# Patient Record
Sex: Male | Born: 1980 | Hispanic: Yes | Marital: Single | Smoking: Never smoker
Health system: Southern US, Community
[De-identification: ages and names within clinical notes are randomized; demographics above are authoritative.]

---

## 2014-01-22 ENCOUNTER — Encounter (HOSPITAL_COMMUNITY): Payer: Self-pay | Admitting: Emergency Medicine

## 2014-01-22 ENCOUNTER — Emergency Department (HOSPITAL_COMMUNITY)
Admission: EM | Admit: 2014-01-22 | Discharge: 2014-01-22 | Disposition: A | Discharge status: Home / Self Care | Payer: Self-pay | Attending: Emergency Medicine | Admitting: Emergency Medicine

## 2014-01-22 DIAGNOSIS — L03119 Cellulitis of unspecified part of limb: Principal | ICD-10-CM

## 2014-01-22 DIAGNOSIS — R269 Unspecified abnormalities of gait and mobility: Secondary | ICD-10-CM | POA: Insufficient documentation

## 2014-01-22 DIAGNOSIS — L02419 Cutaneous abscess of limb, unspecified: Secondary | ICD-10-CM | POA: Insufficient documentation

## 2014-01-22 DIAGNOSIS — M25569 Pain in unspecified knee: Secondary | ICD-10-CM | POA: Insufficient documentation

## 2014-01-22 DIAGNOSIS — IMO0001 Reserved for inherently not codable concepts without codable children: Secondary | ICD-10-CM | POA: Insufficient documentation

## 2014-01-22 DIAGNOSIS — L0291 Cutaneous abscess, unspecified: Secondary | ICD-10-CM

## 2014-01-22 DIAGNOSIS — L0390 Cellulitis, unspecified: Secondary | ICD-10-CM

## 2014-01-22 MED ORDER — HYDROCODONE-ACETAMINOPHEN 5-325 MG PO TABS: ORAL_TABLET | ORAL | Status: DC

## 2014-01-22 MED ORDER — CEPHALEXIN 500 MG PO CAPS: 500.0000 mg | ORAL_CAPSULE | Freq: Four times a day (QID) | ORAL | Status: DC

## 2014-01-22 MED ORDER — CEPHALEXIN 250 MG PO CAPS
500.0000 mg | ORAL_CAPSULE | Freq: Once | ORAL | Status: AC
  Administered 2014-01-22: 500 mg via ORAL
  Filled 2014-01-22: qty 2

## 2014-01-22 MED ORDER — SULFAMETHOXAZOLE-TRIMETHOPRIM 800-160 MG PO TABS: 1.0000 | ORAL_TABLET | Freq: Two times a day (BID) | ORAL | Status: AC

## 2014-01-22 NOTE — ED Notes (Signed)
Pt reports he ran into a cactus on Friday. Pt pulled out multiple spines from skin but now has a red swollen knee.

## 2014-01-22 NOTE — ED Provider Notes (Signed)
  Face-to-face evaluation   History: Patient injured his left knee, when he brushed against a cactus, several days ago. Since then he has had gradually worse. Pain, swelling, and drainage.  Physical exam: Alert, cooperative. He has normal active range of motion. There is redness and swelling of the anterior and medial, left knee. There is an area of central fluctuance and drainage.  Assessment- abscess with cellulitis. Doubt septic arthritis  Medical screening examination/treatment/procedure(s) were conducted as a shared visit with non-physician practitioner(s) and myself.  I personally evaluated the patient during the encounter  Flint MelterElliott L Lindzie Boxx, MD 01/22/14 [phone]

## 2014-01-22 NOTE — Discharge Instructions (Signed)
Please read and follow all provided instructions.  Your diagnoses today include:  1. Cellulitis and abscess     Tests performed today include:  Vital signs. See below for your results today.   Medications prescribed:   Bactrim (trimethoprim/sulfamethoxazole) - antibiotic  You have been prescribed an antibiotic medicine: take the entire course of medicine even if you are feeling better. Stopping early can cause the antibiotic not to work.   Vicodin (hydrocodone/acetaminophen) - narcotic pain medication  DO NOT drive or perform any activities that require you to be awake and alert because this medicine can make you drowsy. BE VERY CAREFUL not to take multiple medicines containing Tylenol (also called acetaminophen). Doing so can lead to an overdose which can damage your liver and cause liver failure and possibly death.  Take any prescribed medications only as directed.   Home care instructions:   Follow any educational materials contained in this packet  Follow-up instructions: Return to the Emergency Department in 48 hours for a recheck if your symptoms are not significantly improved.  Please follow-up with your primary care provider in the next 1 week for further evaluation of your symptoms.   Return instructions:  Return to the Emergency Department if you have:  Fever  Worsening symptoms  Worsening pain  Worsening swelling  Redness of the skin that moves away from the affected area, especially if it streaks away from the affected area   Any other emergent concerns   Your vital signs today were: BP 130/82   Pulse 84   Temp(Src) 99.5 F (37.5 C) (Oral)   Resp 18   Ht 5\' 6"  (1.676 m)   Wt 170 lb (77.111 kg)   BMI 27.45 kg/m2   SpO2 97% If your blood pressure (BP) was elevated above 135/85 this visit, please have this repeated by your doctor within one month. --------------

## 2014-01-22 NOTE — ED Provider Notes (Signed)
CSN: [id number]     Arrival date & time 01/22/14  4098 History   First MD Initiated Contact with Patient 01/22/14 0845     Chief Complaint  Patient presents with  . Knee Injury     (Consider location/radiation/quality/duration/timing/severity/associated sxs/prior Treatment) HPI Comments: Patient presents with knee pain, redness, and swelling that started 3 days ago after he scratched the skin over the knee on the spines of a cactus. No fever, chills. No diabetes or other chronic medical problems. No treatments PTA. The onset of this condition was gradual. The course is worsening. Aggravating factors: walking and movement. Alleviating factors: none.    The history is provided by the patient.    History reviewed. No pertinent past medical history. History reviewed. No pertinent past surgical history. History reviewed. No pertinent family history. History  Substance Use Topics  . Smoking status: Never Smoker   . Smokeless tobacco: Never Used  . Alcohol Use: No    Review of Systems  Constitutional: Negative for fever and chills.  Gastrointestinal: Negative for nausea and vomiting.  Musculoskeletal: Positive for arthralgias, gait problem, joint swelling and myalgias.  Skin: Positive for color change.  Hematological: Negative for adenopathy.    Allergies  Review of patient's allergies indicates not on file.  Home Medications   Prior to Admission medications   Not on File   BP 130/82  Pulse 84  Temp(Src) 99.5 F (37.5 C) (Oral)  Resp 18  Ht 5\' 6"  (1.676 m)  Wt 170 lb (77.111 kg)  BMI 27.45 kg/m2  SpO2 97%  Physical Exam  Nursing note and vitals reviewed. Constitutional: He appears well-developed and well-nourished.  HENT:  Head: Normocephalic and atraumatic.  Eyes: Conjunctivae are normal.  Neck: Normal range of motion. Neck supple.  Pulmonary/Chest: No respiratory distress.  Musculoskeletal: He exhibits edema and tenderness.       Left hip: Normal.       Left  knee: He exhibits decreased range of motion (decreased flexion actively, full ROM passively with pain.), swelling and effusion.       Left ankle: Normal.  Neurological: He is alert.  Skin: Skin is warm and dry.  Extensive area of cellulitis over anterior and medial knee.  Psychiatric: He has a normal mood and affect.          ED Course  Procedures (including critical care time) Labs Review Labs Reviewed - No data to display  Imaging Review No results found.   EKG Interpretation None      8:55 AM Patient seen and examined. Pt discussed with Dr. Effie Shy.    Vital signs reviewed and are as follows: Filed Vitals:   01/22/14 0850  BP: 130/82  Pulse: 84  Temp: 99.5 F (37.5 C)  Resp: 18   Pt seen with Dr. Effie Shy. Plan: I&D, abx, pain medication.   INCISION AND DRAINAGE Performed by: Carolee Rota Consent: Verbal consent obtained. Risks and benefits: risks, benefits and alternatives were discussed Type: abscess  Body area: L knee  Anesthesia: local infiltration  Stab incision was made to a depth of 1/4 inch with a scalpel.  Local anesthetic: lidocaine 2% with epinephrine  Anesthetic total: 3 ml  Complexity: complex Blunt dissection to break up loculations, abscess cavity noted, mostly dry  Drainage: purulent  Drainage amount: minimal  Packing material: none  Patient tolerance: Patient tolerated the procedure well with no immediate complications.   9:54 AM The patient was urged to return to the Emergency Department urgently with worsening pain,  swelling, expanding erythema especially if it streaks away from the affected area, fever, or if they have any other concerns.   The patient was urged to return to the Emergency Department or go to their PCP in 48 hours for wound recheck if the area is not significantly improved.  The patient verbalized understanding and stated agreement with this plan.   Patient counseled on use of narcotic pain medications.  Counseled not to combine these medications with others containing tylenol. Urged not to drink alcohol, drive, or perform any other activities that requires focus while taking these medications. The patient verbalizes understanding and agrees with the plan.   MDM   Final diagnoses:  Cellulitis and abscess   Patient with skin abscess with associated cellulitis amenable to incision and drainage. Abx indicated. Given reasonable ROM, do not suspect joint infection. Spines of cactus were small. Discussed with and seen by attending who agrees.     Renne CriglerJoshua Rishav Rockefeller, PA-C 01/22/14 [phone]

## 2014-01-22 NOTE — ED Notes (Signed)
Declined W/C at D/C and was escorted to lobby by RN. 

## 2014-01-25 ENCOUNTER — Encounter (HOSPITAL_COMMUNITY): Payer: Self-pay | Admitting: Emergency Medicine

## 2014-01-25 ENCOUNTER — Emergency Department (HOSPITAL_COMMUNITY)
Admission: EM | Admit: 2014-01-25 | Discharge: 2014-01-25 | Disposition: A | Discharge status: Home / Self Care | Payer: Self-pay | Attending: Emergency Medicine | Admitting: Emergency Medicine

## 2014-01-25 DIAGNOSIS — Z79899 Other long term (current) drug therapy: Secondary | ICD-10-CM | POA: Insufficient documentation

## 2014-01-25 DIAGNOSIS — Z4801 Encounter for change or removal of surgical wound dressing: Secondary | ICD-10-CM | POA: Insufficient documentation

## 2014-01-25 DIAGNOSIS — L539 Erythematous condition, unspecified: Secondary | ICD-10-CM | POA: Insufficient documentation

## 2014-01-25 DIAGNOSIS — R609 Edema, unspecified: Secondary | ICD-10-CM | POA: Insufficient documentation

## 2014-01-25 DIAGNOSIS — Z5189 Encounter for other specified aftercare: Secondary | ICD-10-CM

## 2014-01-25 NOTE — ED Provider Notes (Signed)
Medical screening examination/treatment/procedure(s) were performed by non-physician practitioner and as supervising physician I was immediately available for consultation/collaboration.   EKG Interpretation None       Doug SouSam Zelia Yzaguirre, MD 01/25/14 1626

## 2014-01-25 NOTE — Discharge Instructions (Signed)
Be sure to continue taking all of your antibiotics.  You may follow up in 3 days for a wound recheck if not improving, return sooner if worsening.  You may use warm compresses like a warm wash cloth a few times a day to help with healing.  See below for further instructions.

## 2014-01-25 NOTE — ED Provider Notes (Signed)
CSN: [id number]     Arrival date & time 01/25/14  0718 History   First MD Initiated Contact with Patient 01/25/14 0730     Chief Complaint  Patient presents with  . Wound Check     (Consider location/radiation/quality/duration/timing/severity/associated sxs/prior Treatment) Patient is a 33 y.o. male presenting with wound check. The history is provided by the patient. The history is limited by a language barrier. A language interpreter was used.  Wound Check Associated symptoms include arthralgias. Pertinent negatives include no chills, fever, myalgias, nausea or vomiting.  Pt is a 33yo male presenting to ED for wound recheck after pt had I&D performed on 01/22/14 due to an abscess on his left knee from an injury from a cactus.  Today, pt states wound is improving, pain is only 1/10, aching, worse with movement. States he is still taking bactrim and pain medication as prescribed.  Denies fever, n/v/d.     History reviewed. No pertinent past medical history. History reviewed. No pertinent past surgical history. No family history on file. History  Substance Use Topics  . Smoking status: Never Smoker   . Smokeless tobacco: Never Used  . Alcohol Use: No    Review of Systems  Constitutional: Negative for fever and chills.  Gastrointestinal: Negative for nausea and vomiting.  Musculoskeletal: Positive for arthralgias. Negative for myalgias.       Left knee  Skin: Positive for color change and wound.  All other systems reviewed and are negative.     Allergies  Review of patient's allergies indicates no known allergies.  Home Medications   Prior to Admission medications   Medication Sig Start Date End Date Taking? Authorizing Provider  HYDROcodone-acetaminophen (NORCO/VICODIN) 5-325 MG per tablet Take 1-2 tablets every 6 hours as needed for severe pain 01/22/14  Yes Renne CriglerJoshua Geiple, PA-C  sulfamethoxazole-trimethoprim (BACTRIM DS,SEPTRA DS) 800-160 MG per tablet Take 1 tablet by mouth 2  (two) times daily. 01/22/14 01/29/14 Yes Joshua Geiple, PA-C   BP 120/66  Pulse 71  Temp(Src) 98.3 F (36.8 C) (Oral)  Resp 18  SpO2 100% Physical Exam  Nursing note and vitals reviewed. Constitutional: He is oriented to person, place, and time. He appears well-developed and well-nourished.  HENT:  Head: Normocephalic and atraumatic.  Eyes: EOM are normal.  Neck: Normal range of motion.  Cardiovascular: Normal rate.   Pulmonary/Chest: Effort normal.  Musculoskeletal: He exhibits edema and tenderness.  Left knee: mild edema and erythema, able to flex past 90 degrees but mildly limited due to pain. Erythema and edema has receded 2-3cm from initial pen markings made on 01/22/14.  No calf tenderness. FROM left hip and left ankle.   Neurological: He is alert and oriented to person, place, and time.  Skin: Skin is warm and dry. There is erythema.  Left knee: 2cm area of induration surrounding surgical site from I&D. No active discharge.  No red streaking. No fluctuance.   Psychiatric: He has a normal mood and affect. His behavior is normal.    ED Course  Procedures (including critical care time) Labs Review Labs Reviewed - No data to display  Imaging Review No results found.   EKG Interpretation None      MDM   Final diagnoses:  Wound check, abscess    Pt presenting to ED for recheck of left knee after I&D of abscess on 01/22/14.  Per pt and via PE, cellulitis appears to be improving well.  Do not believe additional I&D needed at this time. Pt is  still taking bactrim. Denies fever, n/v/d.  Will discharge home. Advised to continue taking antibiotics as prescribed as well as encouraged warm compresses to help with healing process. Advised to f/u in 3 days if not improving, sooner if worsening.     Junius Finner, PA-C 01/25/14 [phone]

## 2014-01-25 NOTE — ED Notes (Signed)
Patient here for wound check on left knee, here approx 3 days ago for I and D of wound, marked with marking pen and d/c home with instructions to follow up today for recheck

## 2014-03-26 ENCOUNTER — Encounter (HOSPITAL_COMMUNITY): Payer: Self-pay | Admitting: Emergency Medicine

## 2014-03-26 ENCOUNTER — Emergency Department (HOSPITAL_COMMUNITY)
Admission: EM | Admit: 2014-03-26 | Discharge: 2014-03-26 | Disposition: A | Discharge status: Home / Self Care | Payer: Self-pay | Source: Home / Self Care

## 2014-03-26 ENCOUNTER — Emergency Department (INDEPENDENT_AMBULATORY_CARE_PROVIDER_SITE_OTHER)
Admission: EM | Admit: 2014-03-26 | Discharge: 2014-03-26 | Disposition: A | Discharge status: Home / Self Care | Payer: Self-pay | Source: Home / Self Care | Attending: Family Medicine | Admitting: Family Medicine

## 2014-03-26 DIAGNOSIS — Z23 Encounter for immunization: Secondary | ICD-10-CM

## 2014-03-26 DIAGNOSIS — L03116 Cellulitis of left lower limb: Secondary | ICD-10-CM

## 2014-03-26 MED ORDER — TETANUS-DIPHTH-ACELL PERTUSSIS 5-2.5-18.5 LF-MCG/0.5 IM SUSP
0.5000 mL | Freq: Once | INTRAMUSCULAR | Status: AC
  Administered 2014-03-26: 0.5 mL via INTRAMUSCULAR

## 2014-03-26 MED ORDER — SULFAMETHOXAZOLE-TMP DS 800-160 MG PO TABS: 1.0000 | ORAL_TABLET | Freq: Two times a day (BID) | ORAL | Status: AC

## 2014-03-26 MED ORDER — TETANUS-DIPHTH-ACELL PERTUSSIS 5-2.5-18.5 LF-MCG/0.5 IM SUSP
INTRAMUSCULAR | Status: AC
  Filled 2014-03-26: qty 0.5

## 2014-03-26 NOTE — Discharge Instructions (Signed)
Warm compresses to affected area four times a day until healed. Take medication as prescribed along with ibuprofen as directed on packaging for pain. Return to clinic in 2 days to have wound re-checked. Wash area 2 x day with warm soapy water, pat dry and cover with clean dry dressing. Celulitis (Cellulitis) La celulitis es una infeccin de la piel y del tejido subyacente. El rea infectada generalmente est de color rojo y duele. Ocurre con ms frecuencia en los brazos y en las piernas.  CAUSAS  La causa es una bacteria que ingresa en la piel a travs de grietas o cortes. Los tipos ms frecuentes de bacterias que causan celulitis son los estafilococos y los estreptococos. SIGNOS Y SNTOMAS   Enrojecimiento y calor.  Hinchazn.  Sensibilidad o dolor.  Grant Ruts. DIAGNSTICO  Por lo general, el mdico puede diagnosticar esta afeccin con un examen fsico. Es posible que le indiquen anlisis de [place]. TRATAMIENTO  El tratamiento consiste en tomar antibiticos. INSTRUCCIONES PARA EL CUIDADO EN EL HOGAR   Tome los antibiticos como le indic el mdico. Termine los antibiticos aunque comience a sentirse mejor.  Mantenga el brazo o la pierna elevados para reducir la hinchazn.  Aplique paos calientes en la zona hasta 4 veces al da para Primary school teacher.  Tome los medicamentos solamente como se lo haya indicado el mdico.  Concurra a todas las visitas de control como se lo haya indicado el mdico. SOLICITE ATENCIN MDICA SI:   Brett Fairy lnea roja en la piel que sale desde la zona infectada.  El rea roja se extiende o se vuelve de color oscuro.  El hueso o la articulacin que se encuentran por debajo de la zona infectada le duelen despus de que la piel se [place].  La infeccin se repite en la misma zona o en una zona diferente.  Tiene un bulto inflamado en la zona infectada.  Presenta nuevos sntomas.  Tiene fiebre. SOLICITE ATENCIN MDICA DE INMEDIATO SI:   Se siente muy  somnoliento.  Tiene vmitos o diarrea.  Se siente enfermo (malestar general) con dolores musculares. ASEGRESE DE QUE:   Comprende estas instrucciones.  Controlar su afeccin.  Recibir ayuda de inmediato si no mejora o si empeora. Document Released: 03/18/2005 Document Revised: 10/23/2013 [facility] Patient Information 2015 [place], [place]. This information is not intended to replace advice given to you by your health care provider. Make sure you discuss any questions you have with your health care provider.  MRSA, Pacientes ambulatorios (Community-Associated MRSA)  CA-MRSA significa estafilococo aureus resistente a la meticilina asociado a la comunidad. Se trata de un tipo de bacteria resistente a algunos antibiticos que puede causar infecciones en la piel y otras zonas. El staphylococcus aureus es una bacteria que vive normalmente en la piel o en la nariz. el estafilococo que se encuentra en la superficie de la piel o en la nariz no causa problemas. Sin embargo, si ingresa al organismo a travs de un corte o una herida, puede producir una infeccin. Hasta no hace mucho tiempo, las infecciones por estafilococo tipo MRSA ocurran en hospitales y otros establecimientos sanitarios. Ahora se observa que causa infecciones en toda la comunidad. El SMRA ocasiona problemas de salud graves que pueden llevar a la [place]. Pero se est volviendo un problema cada vez ms frecuente. Se sabe que se contagia en lugares en los que hay mucha gente, en crceles y prisiones, y en situaciones en las que se producen contactos piel a piel, como durante eventos deportivos o  en vestuarios. Puede contagiarse a travs de utensilios compartidos, como rasuradotas, toallas o equipos deportivos.  CAUSAS Safeco Corporationodos los estafilococos, incluyendo el SAMR normalmente son inocuos excepto que puedan entrar al torrente sanguneo a travs de un rasguo, un corte o una herida como la de [place]una ciruga. Safeco Corporationodos los estafilococos, incluyendo el  SAMR pueden contagiarse de Neomia Dearuna persona a otra tocando objetos contaminados as como a travs del Chemical engineercontacto directo.  MRSA ahora causa la enfermedad en aquellas personas que no han estado en hospitales u otros establecimientos sanitarios. Los casos de enfermedades ocasionadas por MRSA en la comunidad se han asociado al:  Uso reciente de antibiticos.  Compartir toallas o ropa contaminada.  Padecer enfermedades activas de la piel.  Participar en deportes de contacto.  Vivir en condiciones de superpoblacin.  Uso de medicamentos por va intravenosa.  Las infecciones por MRSA relacionadas con la comunidad son normalmente infecciones de la piel, pero pueden causar otros trastornos graves.  La bacteria estafilococo es una de las causas ms comunes de infecciones de la piel. Sin embargo, tambin son causa frecuente de neumona, infecciones en los TransMontaignehuesos o las articulaciones e infecciones en la [place]. DIAGNSTICO El diagnstico se realiza a travs de cultivos o pruebas especiales de Luxembourgmuestra de fluidos provenientes de:  Hisopado tomado de los cortes o heridas en las zonas infectadas.  Hisopados nasales.  Saliva o mucus proveniente de los pulmones (esputo).  Orina.  Sangre. Algunas personas son portadores, pero no presentan signos de infeccin. Esto significa que llevan el germen en su piel o en su nariz pero no desarrollan la infeccin.  TRATAMIENTO El tratamiento vara y se basa en la gravedad, la profundidad y la extensin del proceso infeccioso. Por ejemplo:  Algunas infecciones de la piel, como pequeos granos o abscesos pueden tratarse drenando el pus del lugar de la infeccin.  Las infecciones ms profundas o ms diseminadas de los tejidos blandos generalmente se tratan con ciruga para drenar el pus y antibiticos por va intravenosa o por boca. UnumProvidentEste tratamiento se recomienda an en el caso de las mujeres Meadowlakesembarazadas.  Las infecciones ms graves requieren la hospitalizacin. En  el caso de necesitar antibiticos, el tratamiento durar varias semanas. PREVENCIN Debido a que [place] personas son portadoras, la prevencin del contagio de la bacteria de Neomia Dearuna persona a otra es lo ms importante. El mejor modo de prevenir la diseminacin de las bacterias y otros grmenes es a travs del correcto lavado de las manos o usando desinfectantes con base de alcohol. A continuacin se indican otras formas de evitar el contagio en los establecimientos comunitario.   Verdie DrownLave y frote sus manos con agua tibia y jabn durante al menos 15 segundos. Tambin puede usar desinfectantes con base de alcohol cuando no se dispone de [place] y Bel[place]jabn.  Asegrese de Starwood Hotelsque las personas con las que convive se lavan las manos con frecuencia.  No comparta utensilios personales. Por ejemplo, evite compartir rasuradoras, toallas, ropa y equipos deportivos.  Lave y seque su ropa, y la ropa de cama, a las temperaturas ms calientes recomendadas en las etiquetas.  Mantenga las heridas cubiertas. El pus de las llagas infectadas puede contener las bacterias. Mantenga los cortes y las raspaduras limpios y cubiertos con una venda hasta que curen.  Si tiene una herida que parece estar infectada, consulte con su mdico si debe realizar un cultivo.  Si est amamantando, converse con su mdico acerca de este problema. Podr pedirle que suspenda por un tiempo la Tour managerlactancia materna. INSTRUCCIONES PARA EL  CUIDADO DOMICILIARIO  Utilice los medicamentos tal como se le indic. No saltee medicamentos ni abandone su uso prematuramente slo porque cree que est mejorando.  Los que presentan este tipo de infeccin deben Multimedia programmer contacto con los que los rodean. No use toallas, rasuradotas, cepillos de dientes o ropa de cama que usarn [employer].  Para luchar contra la infeccin, siga las indicaciones de su mdico para el cuidado de la herida. Lvese bien las manos antes y despus de cambiarse el vendaje.  Si tiene un  dispositivo intravascular, como un catter, asegrese que sabe como cuidarlo.  Asegrese de informar a todos los profesionales que lo asistan que usted sufre MRSA, de modo que puedan tomar las medidas necesarias relacionadas con su infeccin. SOLICITE ATENCIN MDICA DE INMEDIATO SI:  La infeccin parece empeorar y observa:  Aumento del calor, inflamacin o dolor alrededor de la herida.  Aparece una lnea roja que se extiende desde el lugar de la infeccin.  La zona de la infeccin se torna de un color oscuro.  La herida drena un lquido de color marrn, amarillo o verdoso.  Despide un olor ftido.  Comienza a sentir nuseas y vmitos o no puede [employer].  Tiene fiebre.  Su beb tiene ms de 3 meses y su temperatura rectal es de 102 F (38.9 C) o ms.  Su beb tiene 3 meses o menos y su temperatura rectal es de 100.4 F (38 C) o ms.  Presenta dificultades respiratorias. EST SEGURO QUE:   Comprende las instrucciones para el alta mdica.  Controlar su enfermedad.  Solicitar atencin mdica de inmediato segn las indicaciones. Document Released: 09/15/2007 Document Revised: 08/31/2011 [facility] Patient Information 2015 [place], [place]. This information is not intended to replace advice given to you by your health care provider. Make sure you discuss any questions you have with your health care provider.

## 2014-03-26 NOTE — ED Notes (Signed)
C/o 4 day duration of pain and swelling left knee. Left knee hot to touch, lesion noted

## 2014-03-26 NOTE — ED Provider Notes (Signed)
CSN: [id number]     Arrival date & time 03/26/14  1137 History   First MD Initiated Contact with Patient 03/26/14 1237     Chief Complaint  Patient presents with  . Knee Pain   (Consider location/radiation/quality/duration/timing/severity/associated sxs/prior Treatment) HPI Comments: Reports area of redness, swelling and tenderness at left lateral knee, similar to that of 01/2014. Reports complete resolution of symptoms in August with use of TMP/SMX. Denies fever/chills. 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 No known injury.  PCP: none   Patient is a 33 y.o. male presenting with knee pain. The history is provided by the patient. The history is limited by a language barrier. A language interpreter was used.  Knee Pain Location:  Knee Time since incident:  4 days Injury: no   Knee location:  L knee Chronicity:  Recurrent   History reviewed. No pertinent past medical history. History reviewed. No pertinent past surgical history. History reviewed. No pertinent family history. History  Substance Use Topics  . Smoking status: Never Smoker   . Smokeless tobacco: Never Used  . Alcohol Use: No    Review of Systems  All other systems reviewed and are negative.   Allergies  Review of patient's allergies indicates no known allergies.  Home Medications   Prior to Admission medications   Medication Sig Start Date End Date Taking? Authorizing Provider  HYDROcodone-acetaminophen (NORCO/VICODIN) 5-325 MG per tablet Take 1-2 tablets every 6 hours as needed for severe pain 01/22/14   Renne CriglerJoshua Geiple, PA-C  sulfamethoxazole-trimethoprim (BACTRIM DS) 800-160 MG per tablet Take 1 tablet by mouth 2 (two) times daily. X 7 days 03/26/14   Jess BartersJennifer Lee H Izan Miron, PA   BP 112/74  Pulse 72  Temp(Src) 98.9 F (37.2 C) (Oral)  Resp 16  SpO2 98% Physical Exam  Nursing note and vitals reviewed. Constitutional: He is oriented to person, place, and time. He appears  well-developed and well-nourished. No distress.  HENT:  Head: Normocephalic and atraumatic.  Eyes: Conjunctivae are normal. No scleral icterus.  Cardiovascular: Normal rate.   Pulmonary/Chest: Effort normal.  Musculoskeletal: Normal range of motion.       Left knee: He exhibits swelling and erythema. He exhibits normal range of motion, no effusion, no ecchymosis, no deformity and no laceration.       Legs: Neurological: He is alert and oriented to person, place, and time.  Skin: Skin is warm and dry. No rash noted. There is erythema. No pallor.  13cm x 8cm area of induration and erythema at left lateral knee.  Psychiatric: He has a normal mood and affect. His behavior is normal.    ED Course  Procedures (including critical care time) Labs Review Labs Reviewed - No data to display  Imaging Review No results found.   MDM   1. Cellulitis of left knee    Left knee cellulitis without clinical indication of septic arthritis. Area of redness and induration outlined with surgical marker and wound dressed. No area of fluctuance to suggest need for I&D. [facility] wound re-check and to take TMP/SMX as prescribed along with warm compresses to affected area QID until healed. [facility] given that central portion of wound open and patient unaware of his tetanus status.     Ria ClockJennifer Lee H Nazareth Kirk, GeorgiaPA 03/26/14 1336

## 2014-03-28 ENCOUNTER — Encounter (HOSPITAL_COMMUNITY): Payer: Self-pay | Admitting: Emergency Medicine

## 2014-03-28 ENCOUNTER — Emergency Department (INDEPENDENT_AMBULATORY_CARE_PROVIDER_SITE_OTHER): Payer: Self-pay

## 2014-03-28 ENCOUNTER — Emergency Department (INDEPENDENT_AMBULATORY_CARE_PROVIDER_SITE_OTHER)
Admission: EM | Admit: 2014-03-28 | Discharge: 2014-03-28 | Disposition: A | Discharge status: Home / Self Care | Payer: Self-pay | Source: Home / Self Care | Attending: Family Medicine | Admitting: Family Medicine

## 2014-03-28 DIAGNOSIS — L03119 Cellulitis of unspecified part of limb: Secondary | ICD-10-CM

## 2014-03-28 DIAGNOSIS — L02419 Cutaneous abscess of limb, unspecified: Secondary | ICD-10-CM

## 2014-03-28 DIAGNOSIS — L089 Local infection of the skin and subcutaneous tissue, unspecified: Secondary | ICD-10-CM

## 2014-03-28 MED ORDER — DOXYCYCLINE HYCLATE 100 MG PO CAPS: 100.0000 mg | ORAL_CAPSULE | Freq: Two times a day (BID) | ORAL | Status: DC

## 2014-03-28 MED ORDER — CEPHALEXIN 500 MG PO CAPS: 500.0000 mg | ORAL_CAPSULE | Freq: Two times a day (BID) | ORAL | Status: DC

## 2014-03-28 NOTE — ED Provider Notes (Signed)
Medical screening examination/treatment/procedure(s) were performed by a resident physician or non-physician practitioner and as the supervising physician I was immediately available for consultation/collaboration.  Jacqulyne Gladue, MD    Monroe Toure S Yarethzi Branan, MD 03/28/14 0759 

## 2014-03-28 NOTE — ED Provider Notes (Signed)
CSN: [id number]     Arrival date & time 03/28/14  1305 History   First MD Initiated Contact with Patient 03/28/14 1444     Chief Complaint  Patient presents with  . Knee Pain   (Consider location/radiation/quality/duration/timing/severity/associated sxs/prior Treatment) Patient is a 33 y.o. male presenting with knee pain. The history is provided by the patient.  Knee Pain Location:  Knee Injury: yes   Mechanism of injury comment:  Cactus puncture of left knee 8/3 , improved with abx, then relapsed 10/5,  now purulent drainage Knee location:  L knee Pain details:    Severity:  Moderate   Progression:  Worsening Chronicity:  Recurrent Dislocation: no   Foreign body present:  Unable to specify Tetanus status:  Up to date Prior injury to area:  Yes Associated symptoms: decreased ROM and swelling     History reviewed. No pertinent past medical history. History reviewed. No pertinent past surgical history. History reviewed. No pertinent family history. History  Substance Use Topics  . Smoking status: Never Smoker   . Smokeless tobacco: Never Used  . Alcohol Use: No    Review of Systems  Musculoskeletal: Positive for joint swelling.  Skin: Positive for wound.    Allergies  Review of patient's allergies indicates no known allergies.  Home Medications   Prior to Admission medications   Medication Sig Start Date End Date Taking? Authorizing Provider  HYDROcodone-acetaminophen (NORCO/VICODIN) 5-325 MG per tablet Take 1 tablet by mouth every 4 (four) hours as needed for moderate pain or severe pain (para dolor). 03/29/14   Trixie DredgeEmily West, PA-C  ibuprofen (ADVIL,MOTRIN) 800 MG tablet Take 1 tablet (800 mg total) by mouth every 8 (eight) hours as needed for mild pain or moderate pain (para dolor). 03/29/14   Trixie DredgeEmily West, PA-C  sulfamethoxazole-trimethoprim (BACTRIM DS) 800-160 MG per tablet Take 1 tablet by mouth 2 (two) times daily. X 7 days 03/26/14   Jess BartersJennifer Lee H Presson, PA   BP  126/64  Pulse 64  Temp(Src) 98.9 F (37.2 C) (Oral)  SpO2 96% Physical Exam  Nursing note and vitals reviewed. Constitutional: He is oriented to person, place, and time. He appears well-developed and well-nourished.  Musculoskeletal: He exhibits tenderness.       Left knee: He exhibits decreased range of motion and swelling.       Legs: Neurological: He is alert and oriented to person, place, and time.  Skin: Skin is warm. There is erythema.    ED Course  Procedures (including critical care time) Labs Review Labs Reviewed  CULTURE, ROUTINE-ABSCESS    Imaging Review No results found.   MDM   1. Infection of skin of knee   2. Cellulitis and abscess of lower extremity        Linna HoffJames D Stefanie Hodgens, MD 04/15/14 1056

## 2014-03-28 NOTE — Discharge Instructions (Signed)
Absceso °(Abscess) ° Un absceso es una zona infectada que contiene pus y desechos. Puede aparecer en cualquier parte del cuerpo. También se lo conoce como forúnculo o divieso. °CAUSAS  °Ocurre cuando los tejidos se infectan. También puede formarse por obstrucción de las glándulas sebáceas o las glándulas sudoríparas, infección de los folículos pilosos o por una lesión pequeña en la piel. A medida que el organismo lucha contra la infección, se acumula pus en la zona y hace presión debajo de la piel. Esta presión causa dolor. Las personas con un sistema inmunológico debilitado tienen dificultad para luchar contra las infecciones y pueden formar abscesos con más frecuencia.  °SÍNTOMAS  °Generalmente un absceso se forma sobre la piel y se vuelve una masa dolorosa, roja, caliente y sensible. Si se forma debajo de la piel, podrá sentir como una zona blanda, que se mueve, debajo de la piel. Algunos abscesos se abren (ruptura) por sí mismos, pero la mayoría seguirá empeorando si no se lo trata. La infección puede diseminarse hacia otros sitios del cuerpo y finalmente al torrente sanguíneo y hace que el enfermo se sienta mal.  °DIAGNÓSTICO  °El médico le hará una historia clínica y un examen físico. Podrán tomarle una muestra de líquido del absceso y analizarlo para encontrar la causa de la infección. .  °TRATAMIENTO  °El médico le indicará antibióticos para combatir la infección. Sin embargo, el uso de antibióticos solamente no curará el absceso. El médico tendrá que hacer un pequeño corte (incisión) en el absceso para drenar el pus. En algunos casos se introduce una gasa en el absceso para reducir el dolor y que siga drenando la zona.  °INSTRUCCIONES PARA EL CUIDADO EN EL HOGAR  °· Solo tome medicamentos de venta libre o recetados para el dolor, malestar o fiebre, según las indicaciones del médico. °· Si le han recetado antibióticos, tómelos según las indicaciones. Tómelos todos, aunque se sienta mejor. °· Si le aplicaron  una gasa, siga las indicaciones del médico para cambiarla. °· Para evitar la propagación de la infección: °¨ Mantenga el absceso cubierto con el vendaje. °¨ Lávese bien las manos. °¨ No comparta artículos de cuidado personal, toallas o jacuzzis con los demás. °¨ Evite el contacto con la piel de otras personas. °· Mantenga la piel y la ropa limpia alrededor del absceso. °· Cumpla con todas las visitas de control, según le indique su médico. °SOLICITE ATENCIÓN MÉDICA SI:  °· Aumenta el dolor, la hinchazón, el enrojecimiento, drena líquido o sangra. °· Siente dolores musculares, escalofríos, o una sensación general de malestar. °· Tiene fiebre. °ASEGÚRESE DE QUE:  °· Comprende estas instrucciones. °· Controlará su enfermedad. °· Solicitará ayuda de inmediato si no mejora o si empeora. °Document Released: 06/08/2005 Document Revised: 12/08/2011 °ExitCare® Patient Information ©2015 ExitCare, LLC. This information is not intended to replace advice given to you by your health care provider. Make sure you discuss any questions you have with your health care provider. ° °

## 2014-03-28 NOTE — ED Provider Notes (Signed)
CSN: [id number]     Arrival date & time 03/28/14  1305 History   First MD Initiated Contact with Patient 03/28/14 1444     Chief Complaint  Patient presents with  . Knee Pain   (Consider location/radiation/quality/duration/timing/severity/associated sxs/prior Treatment) HPI Comments: Patient initially evaluated by Dr. Artis FlockKindl, then myself.  History of cactus puncture 01/22/2014. Treated at that time with Bactrim and improved. Returned here on 10/03 with erythema and warmth and treated with Bactrim. Presented today with worsening swelling, erythema and warmth. No fever or chills. No N,V. Otherwise feels well.   Patient is a 33 y.o. male presenting with knee pain. The history is provided by the patient.  Knee Pain   History reviewed. No pertinent past medical history. History reviewed. No pertinent past surgical history. History reviewed. No pertinent family history. History  Substance Use Topics  . Smoking status: Never Smoker   . Smokeless tobacco: Never Used  . Alcohol Use: No    Review of Systems  All other systems reviewed and are negative.   Allergies  Review of patient's allergies indicates no known allergies.  Home Medications   Prior to Admission medications   Medication Sig Start Date End Date Taking? Authorizing Provider  cephALEXin (KEFLEX) 500 MG capsule Take 1 capsule (500 mg total) by mouth 2 (two) times daily. 03/28/14   Riki SheerMichelle G Kaylani Fromme, PA-C  doxycycline (VIBRAMYCIN) 100 MG capsule Take 1 capsule (100 mg total) by mouth 2 (two) times daily. 03/28/14   Riki SheerMichelle G Saya Mccoll, PA-C  HYDROcodone-acetaminophen (NORCO/VICODIN) 5-325 MG per tablet Take 1-2 tablets every 6 hours as needed for severe pain 01/22/14   Renne CriglerJoshua Geiple, PA-C  sulfamethoxazole-trimethoprim (BACTRIM DS) 800-160 MG per tablet Take 1 tablet by mouth 2 (two) times daily. X 7 days 03/26/14   Jess BartersJennifer Lee H Presson, PA   BP 126/64  Pulse 64  Temp(Src) 98.9 F (37.2 C) (Oral)  SpO2 96% Physical Exam   Nursing note and vitals reviewed. Constitutional: He is oriented to person, place, and time. He appears well-developed and well-nourished. No distress.  Ambulating without problem  Pulmonary/Chest: Effort normal.  Musculoskeletal:  Left knee with 10cm approximately area or erythema and warmth, swelling surrounding area laterally. Opening-pin hole; expressed copious amounts of pus. Pain with palpation. Full ROM  Neurological: He is alert and oriented to person, place, and time.  Skin: Skin is warm and dry. He is not diaphoretic.  Psychiatric: His behavior is normal.    ED Course  Procedures (including critical care time) Labs Review Labs Reviewed  WOUND CULTURE    Imaging Review Dg Knee Complete 4 Views Left  03/28/2014   CLINICAL DATA:  Pain and swelling left knee since August, 2015. Subsequent encounter. Left knee pain.  EXAM: LEFT KNEE - COMPLETE 4+ VIEW  COMPARISON:  None.  FINDINGS: There is no bony destructive change or periosteal reaction. No joint effusion is identified. No radiopaque foreign body is seen.  IMPRESSION: Negative exam.   Electronically Signed   By: Drusilla Kannerhomas  Dalessio M.D.   On: 03/28/2014 15:58     MDM   1. Infection of skin of knee   2. Cellulitis and abscess of lower extremity    Discussed initially with Dr. Artis FlockKindl who tried multiple times to reach Dr. Ophelia CharterYates (Orthopedic on call). Then discussed with Dr. Denyse Amassorey. We recommend patient to go to ER for further evaluation and possible I&D; however patient refused to go today and reported that he would go tomorrow. He is aware of risk  of further infection or sepsis (all explained through interpreter). He does not appear ill or septic.  Wound rechecked and opening intact for drainage. Will cover with Doxycycline and Keflex and encourage ER tomorrow.     Riki Sheer, PA-C 03/28/14 [phone]

## 2014-03-28 NOTE — ED Notes (Signed)
Pain and swelling left knee ; recheck of 10-5 visit

## 2014-03-29 ENCOUNTER — Encounter (HOSPITAL_COMMUNITY): Payer: Self-pay | Admitting: Emergency Medicine

## 2014-03-29 ENCOUNTER — Emergency Department (HOSPITAL_COMMUNITY)
Admission: EM | Admit: 2014-03-29 | Discharge: 2014-03-29 | Disposition: A | Discharge status: Home / Self Care | Payer: Self-pay | Attending: Emergency Medicine | Admitting: Emergency Medicine

## 2014-03-29 DIAGNOSIS — L03116 Cellulitis of left lower limb: Secondary | ICD-10-CM | POA: Insufficient documentation

## 2014-03-29 DIAGNOSIS — L02419 Cutaneous abscess of limb, unspecified: Secondary | ICD-10-CM

## 2014-03-29 DIAGNOSIS — Z792 Long term (current) use of antibiotics: Secondary | ICD-10-CM | POA: Insufficient documentation

## 2014-03-29 DIAGNOSIS — L03119 Cellulitis of unspecified part of limb: Secondary | ICD-10-CM

## 2014-03-29 DIAGNOSIS — L02416 Cutaneous abscess of left lower limb: Secondary | ICD-10-CM | POA: Insufficient documentation

## 2014-03-29 MED ORDER — HYDROCODONE-ACETAMINOPHEN 5-325 MG PO TABS: 1.0000 | ORAL_TABLET | ORAL | Status: AC | PRN

## 2014-03-29 MED ORDER — IBUPROFEN 800 MG PO TABS: 800.0000 mg | ORAL_TABLET | Freq: Three times a day (TID) | ORAL | Status: AC | PRN

## 2014-03-29 NOTE — ED Notes (Signed)
Patient states went to urgent care yesterday and was told he had an infection in L knee.   Patient states he was supposed to come to ED to get treated for infection and to see "specialist".   Patient states he wants additional I & D of area.

## 2014-03-29 NOTE — ED Provider Notes (Signed)
CSN: [id number]     Arrival date & time 03/29/14  1311 History   First MD Initiated Contact with Patient 03/29/14 1501     Chief Complaint  Patient presents with  . knee infection      (Consider location/radiation/quality/duration/timing/severity/associated sxs/prior Treatment) The history is provided by the patient and medical records.    Patient presents to emergency department with cellulitis/abscess over left knee.  Pt states he had cactus spines removed from his left knee (medial aspect) two months ago, had abscess drained, took antibiotics and pain medication, 8 days later was back to normal.  5 days ago he started having a similar infection over the lateral aspect of the same knee.  Was started on bactrim on 03/26/14 and was checked two days later and switched to doxycyline and keflex.  Pt states he is taking 1 Po BID of each.  He was urged to come to ED yesterday and orthopedic consultation was attempted without reaching orthopedist on call - pt declined transfer to ED.  Comes today for recheck, worried because urgent care had urged him to come out of concern for "infection in the bone."  States he is feeling much better.  Denies fevers, chills, myalgias, N/V, weakness or numbness of the left leg.  He is able to range his left knee without difficulty or signficant pain.  States pain in his left knee is mild.  He is not immunocompromised.   History reviewed. No pertinent past medical history. History reviewed. No pertinent past surgical history. No family history on file. History  Substance Use Topics  . Smoking status: Never Smoker   . Smokeless tobacco: Never Used  . Alcohol Use: No    Review of Systems  All other systems reviewed and are negative.     Allergies  Review of patient's allergies indicates no known allergies.  Home Medications   Prior to Admission medications   Medication Sig Start Date End Date Taking? Authorizing Provider  cephALEXin (KEFLEX) 500 MG  capsule Take 500 mg by mouth 2 (two) times daily. 03/28/14 04/08/14 Yes Historical Provider, MD  doxycycline (VIBRAMYCIN) 100 MG capsule Take 100 mg by mouth 2 (two) times daily. 03/28/14 04/08/14 Yes Historical Provider, MD  sulfamethoxazole-trimethoprim (BACTRIM DS) 800-160 MG per tablet Take 1 tablet by mouth 2 (two) times daily. X 7 days 03/26/14  Yes Jess BartersJennifer Lee H Presson, PA   BP 113/65  Pulse 59  Temp(Src) 98.6 F (37 C) (Oral)  Resp 15  SpO2 98% Physical Exam  Nursing note and vitals reviewed. Constitutional: He appears well-developed and well-nourished. No distress.  HENT:  Head: Normocephalic and atraumatic.  Neck: Neck supple.  Pulmonary/Chest: Effort normal.  Musculoskeletal:       Left knee: He exhibits normal range of motion.       Legs: Left knee full AROM without pain.  Distal pulses and sensation intact.   Neurological: He is alert.  Skin: He is not diaphoretic.    ED Course  Procedures (including critical care time) Labs Review Labs Reviewed - No data to display  Imaging Review Dg Knee Complete 4 Views Left  03/28/2014   CLINICAL DATA:  Pain and swelling left knee since August, 2015. Subsequent encounter. Left knee pain.  EXAM: LEFT KNEE - COMPLETE 4+ VIEW  COMPARISON:  None.  FINDINGS: There is no bony destructive change or periosteal reaction. No joint effusion is identified. No radiopaque foreign body is seen.  IMPRESSION: Negative exam.   Electronically Signed   By:  Drusilla Kanner M.D.   On: 03/28/2014 15:58     EKG Interpretation None       3:55 PM Discussed pt with Dr Criss Alvine who will also examine the patient.  MDM   Final diagnoses:  Cellulitis and abscess of leg    Afebrile, nontoxic patient with improving left knee cellulitis and abscess.  I&D performed at urgent care, pt has been on bactrim, switched to doxy and keflex.  Pt taking all medications.  He has no pain medication at home.  Full AROM without pain.  No fevers.  Course is improving.  Doubt septic joint and osteomyelitis.  Pt also seen by Dr Criss Alvine who agrees with plan. D/C home with continued antibiotics, pain medication.  Discussed result, findings, treatment, and follow up  with patient.  Pt given return precautions.  Pt verbalizes understanding and agrees with plan.         Trixie Dredge, PA-C 03/29/14 2107

## 2014-03-29 NOTE — Discharge Instructions (Signed)
Read the information below.  Use the prescribed medication as directed.  Please discuss all new medications with your pharmacist.  Do not take additional tylenol while taking the prescribed pain medication to avoid overdose.  You may return to the Emergency Department at any time for worsening condition or any new symptoms that concern you.  If you develop increased redness, swelling, pus draining from the wound, or fevers greater than 100.4, return to the ER immediately for a recheck.     Lea la informacin a continuacin. Use el medicamento recetado como se indica. Por favor, discuta todos los nuevos medicamentos con su farmacutico. No tome Tylenol adicional mientras est tomando el medicamento para el dolor recetada para evitar sobredosis. Usted puede volver a la sala de Oceanographerurgencias en cualquier momento por el empeoramiento de la condicin o nuevos sntomas que le preocupan . Si usted desarrolla enrojecimiento incrementado, hinchazn, pus de la herida , o fiebre de ms de 100.4 , volver a la sala de emergencias.     Absceso - Cuidados posteriores  (Abscess, Care After) Un absceso (tambin llamado divieso o fornculo) es una zona infectada que contiene pus. Los signos y los sntomas de un absceso Environmental education officerincluyen dolor, sensibilidad, enrojecimiento o Civil engineer, contractingtumefaccin, o puede sentir una zona blanda que se desplaza debajo de la piel. Un absceso puede presentarse en cualquier parte del cuerpo. La infeccin puede extenderse a los tejidos circundantes y causar celulitis. El cirujano le ha practicado un corte (incisin) sobre el absceso y el pus ha drenado. En ese espacio le han colocado una gasa para facilitar el drenaje que permitir que la cavidad se cure desde Geophysical data processoradentro hacia el exterior. El absceso puede doler durante 5 a [address] das. La mayor parte de las personas no presentan fiebre. Si el absceso fue controlado en una etapa temprana, puede que no est localizado y entonces es posible que no lo drenen. En este caso, en  necesario que programe otra cita si no mejora por s mismo o con medicacin.  INSTRUCCIONES PARA EL CUIDADO EN EL HOGAR   Solo tome medicamentos de venta libre o recetados para Chief Technology Officerel dolor, Dentistmalestar o la fiebre, segn las indicaciones del mdico.  En el momento del bao, remoje y Engineer, miningluego retire la gasa o la compresa con yodoformo, al Huntsman Corporationmenos todos los das o segn le haya indicado el profesional que lo asiste. Luego puede lavar la herida suavemente con [place] jabonosa. Luego vuelva a Astronomercolocar la gasa segn se lo haya indicado el profesional que lo asiste. SOLICITE ATENCIN MDICA DE INMEDIATO SI:   Presenta dolor intenso, hinchazn, enrojecimiento, drena lquido o sangra en la regin de la herida.  Aparecen signos de infeccin generalizada, incluyendo dolores musculares, escalofros, fiebre, o una sensacin general de Dentistmalestar.  La temperatura oral le sube a ms de 38,9 C (102 F) y no puede controlarla con medicamentos. Consulte al mdico para un nuevo control o en caso que presente alguno de los sntomas descritos ms [place]. Si le recetan medicamentos (antibiticos), tmelos tal como se le indic.  Document Released: 05/25/2012 [facility] Patient Information 2015 [place], [place]. This information is not intended to replace advice given to you by your health care provider. Make sure you discuss any questions you have with your health care provider.  Celulitis (Celulitis)  La celulitis es una infeccin de la piel y del tejido que se encuentra debajo de la piel. El rea infectada generalmente est de color rojo y duele. Ocurre con ms frecuencia en los brazos y en las  piernas. CUIDADOS EN EL HOGAR   Tome los antibiticos tal como se le indic. Finalice el [place], aunque comience a Actor.  Mantenga el brazo o la pierna infectada elevada.  Aplique paos calientes en la zona hasta 4 veces al da.  Tome slo los medicamentos que le haya indicado el mdico.  Cumpla con los controles  mdicos segn las indicaciones. SOLICITE AYUDA SI:  Observa una lnea roja en la piel que sale desde la herida.  El rea roja se extiende o se vuelve de color oscuro.  El hueso o la articulacin que se encuentran por debajo de la zona infectada le duelen despus de que la piel se [place].  La infeccin se repite en la misma zona o en una zona diferente.  Tiene un bulto inflamado (hinchado) en la zona infectada.  Aparecen nuevos sntomas.  Tiene fiebre. SOLICITE AYUDA DE INMEDIATO SI:   Se siente muy somnoliento.  Vomita o tiene diarrea.  Se siente enfermo y tiene [employer]. ASEGRESE DE QUE:   Comprende estas instrucciones.  Controlar su enfermedad.  Solicitar ayuda de inmediato si no mejora o si empeora. Document Released: 11/26/2009 Document Revised: 10/23/2013 [facility] Patient Information 2015 [place], [place]. This information is not intended to replace advice given to you by your health care provider. Make sure you discuss any questions you have with your health care provider.

## 2014-03-29 NOTE — ED Notes (Signed)
Knee wrapped with gauze and ace bandage.

## 2014-03-29 NOTE — ED Notes (Signed)
Applied ace wrap to left knee prior to D/c

## 2014-03-30 NOTE — ED Provider Notes (Signed)
Medical screening examination/treatment/procedure(s) were conducted as a shared visit with non-physician practitioner(s) and myself.  I personally evaluated the patient during the encounter.   EKG Interpretation None       Patient has improving cellulitis based on line drawn day before arrival. No fevers. No joint swelling and has full ROM of knee without pain. Highly doubt septic joint. As his cellulitis is improving, will keep on abx and give pain meds. Discussed strict return precautions  Audree CamelScott T Cachet Mccutchen, MD 03/30/14 1651

## 2014-03-31 NOTE — ED Provider Notes (Signed)
Medical screening examination/treatment/procedure(s) were performed by a resident physician or non-physician practitioner and as the supervising physician I was immediately available for consultation/collaboration.  Monta Maiorana, MD    Gael Londo S Jaylenne Hamelin, MD 03/31/14 0921 

## 2014-04-01 LAB — CULTURE, ROUTINE-ABSCESS

## 2014-04-05 ENCOUNTER — Telehealth (HOSPITAL_COMMUNITY): Payer: Self-pay | Admitting: *Deleted

## 2014-04-05 NOTE — ED Notes (Signed)
Abscess culture L knee: Mod. MRSA. Pt. adequately treated with Doxycycline and also got Keflex.  I called but pt.'s phone is not in service.  I called contact and left a message for pt. to call me.  Call 1. Vassie MoselleYork, Courtney Fenlon M 04/05/2014

## 2014-04-10 NOTE — ED Notes (Signed)
Unable to reach pt. X 3 by phone.  Confidential marked letter sent with lab result and MRSA instruction sheet. Vassie MoselleYork, Senan Urey M 04/10/2014

## 2014-05-15 ENCOUNTER — Telehealth (HOSPITAL_COMMUNITY): Payer: Self-pay | Admitting: *Deleted

## 2014-05-15 NOTE — ED Notes (Signed)
Letter returned unopened.  Unable to reach pt. 05/15/2014

## 2016-03-08 IMAGING — CR DG KNEE COMPLETE 4+V*L*
4 series · 4 of 4 positions shown · non-contrast
Comparison: None.

CLINICAL DATA: Pain and swelling left knee since January 2014.
Subsequent encounter. Left knee pain.

EXAM:
LEFT KNEE - COMPLETE 4+ VIEW

[knee ap]
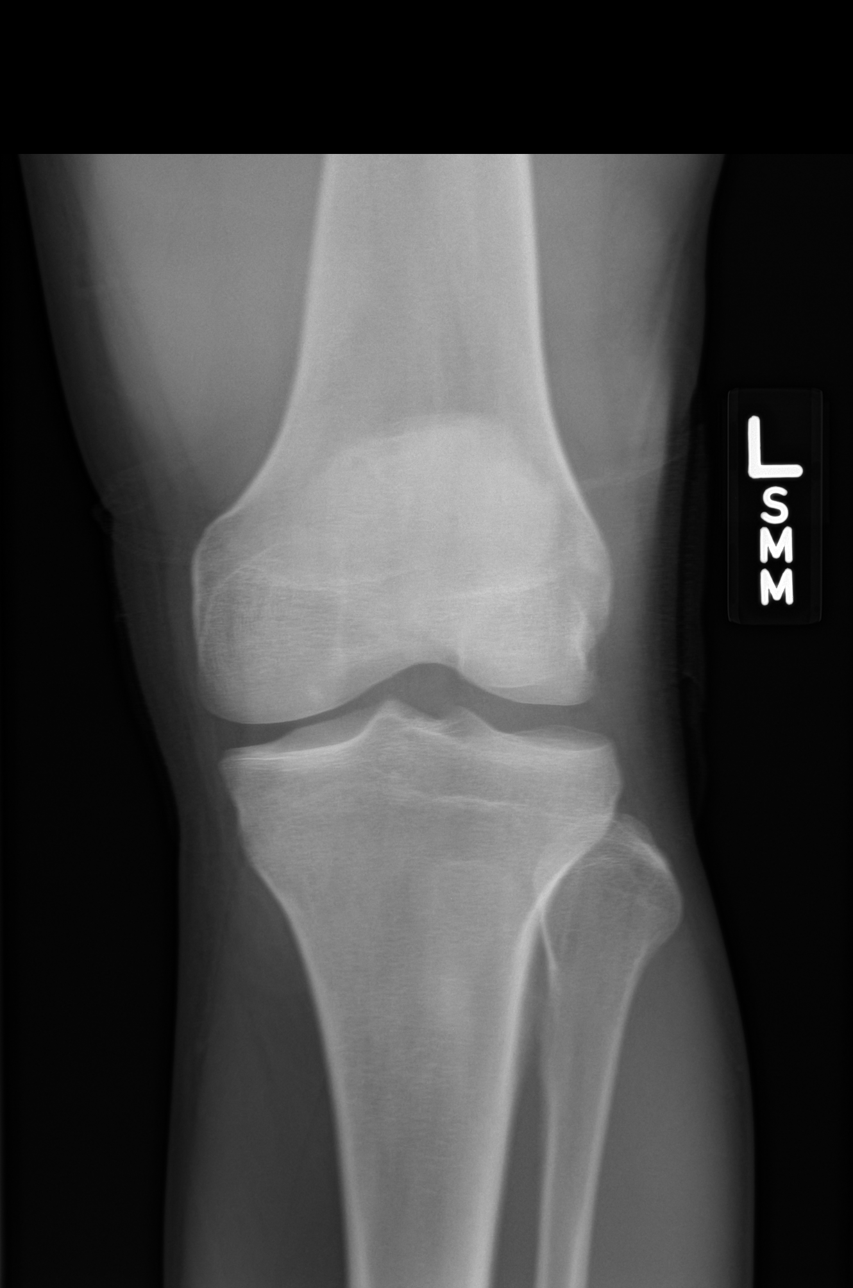

[knee lat]
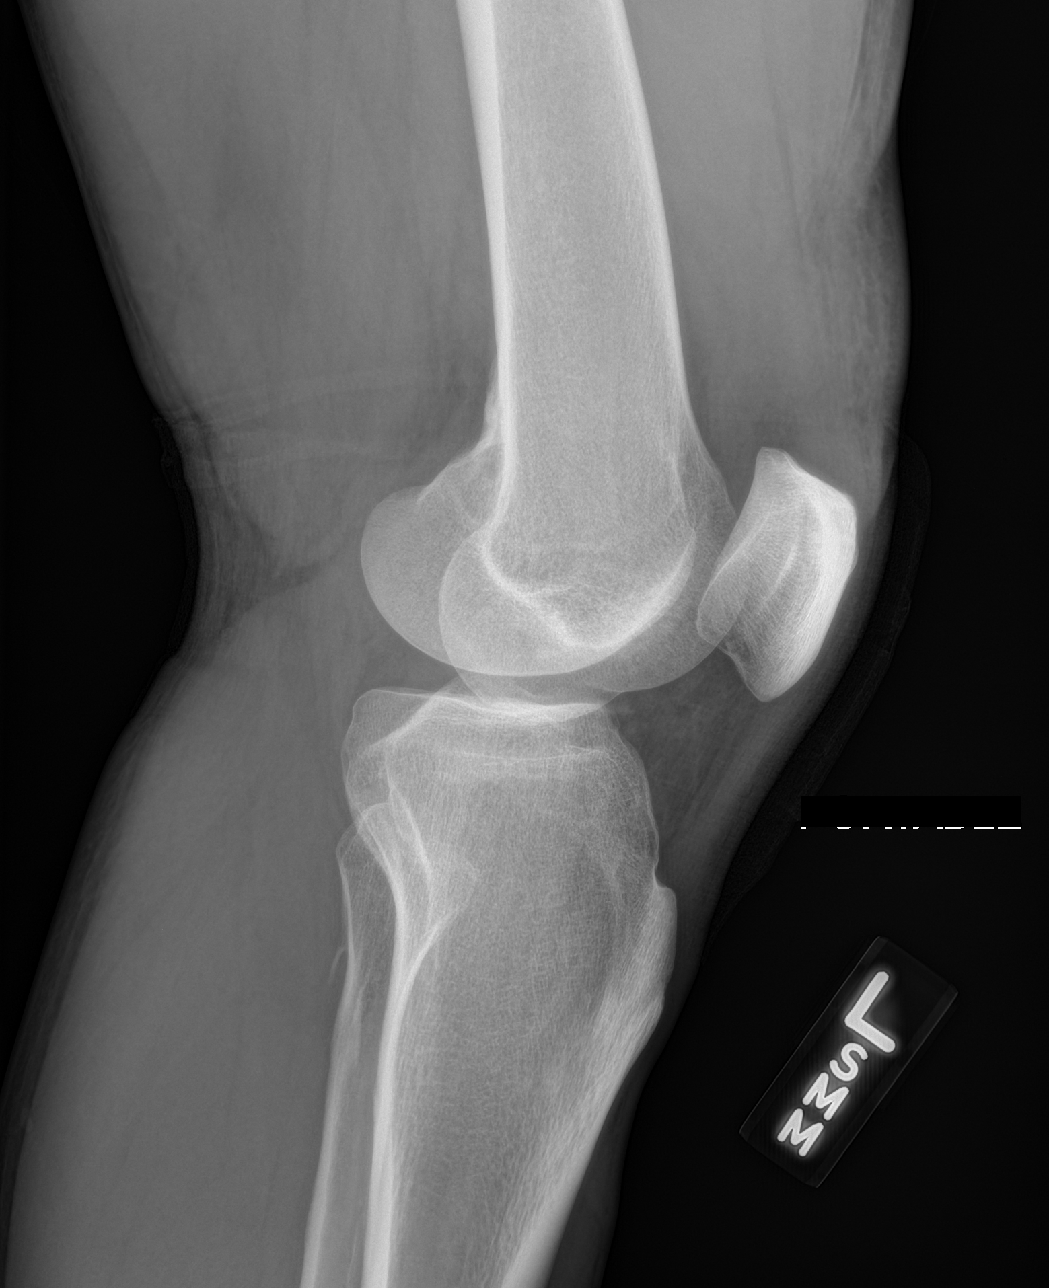

[knee obl (1 of 2)]
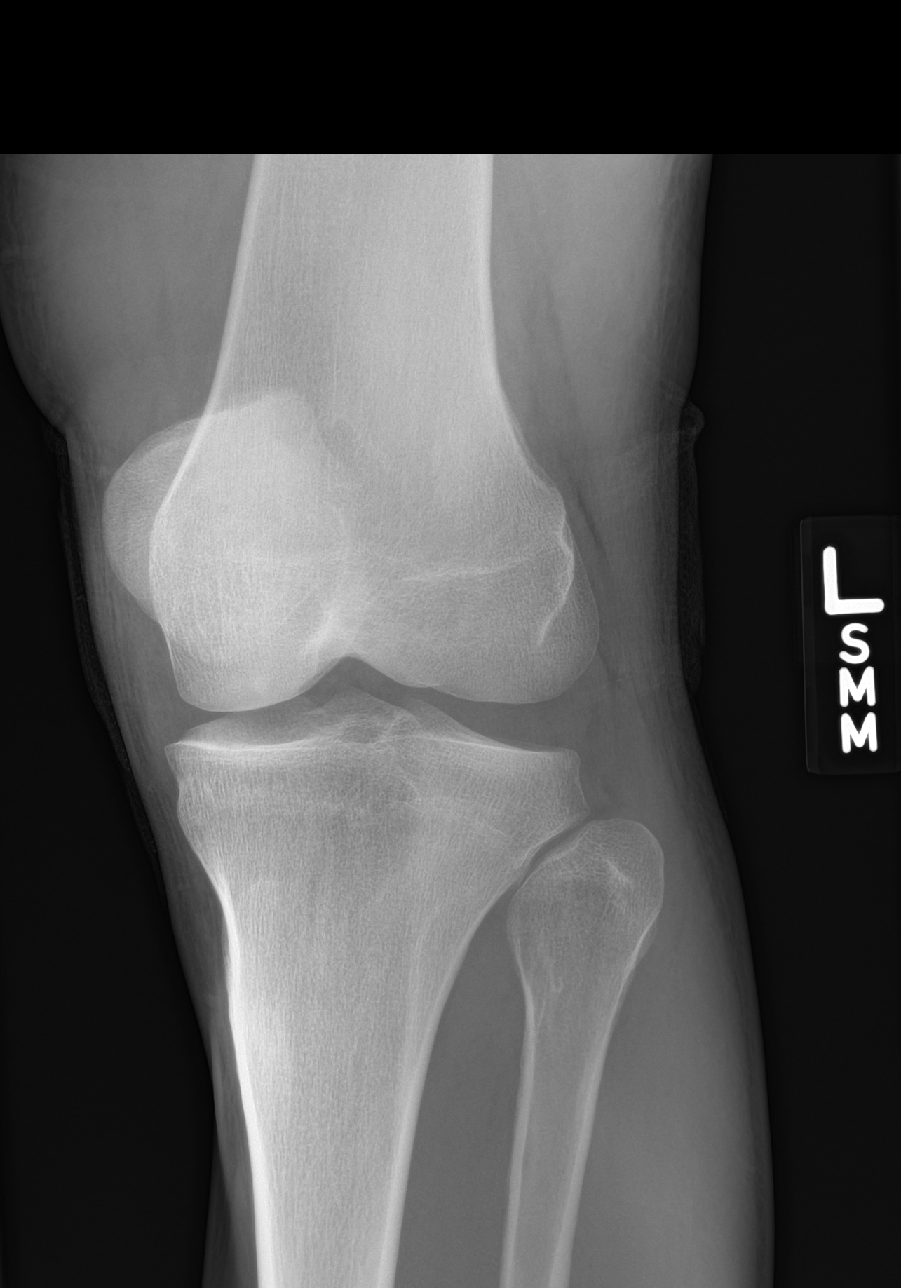

[knee obl (2 of 2)]
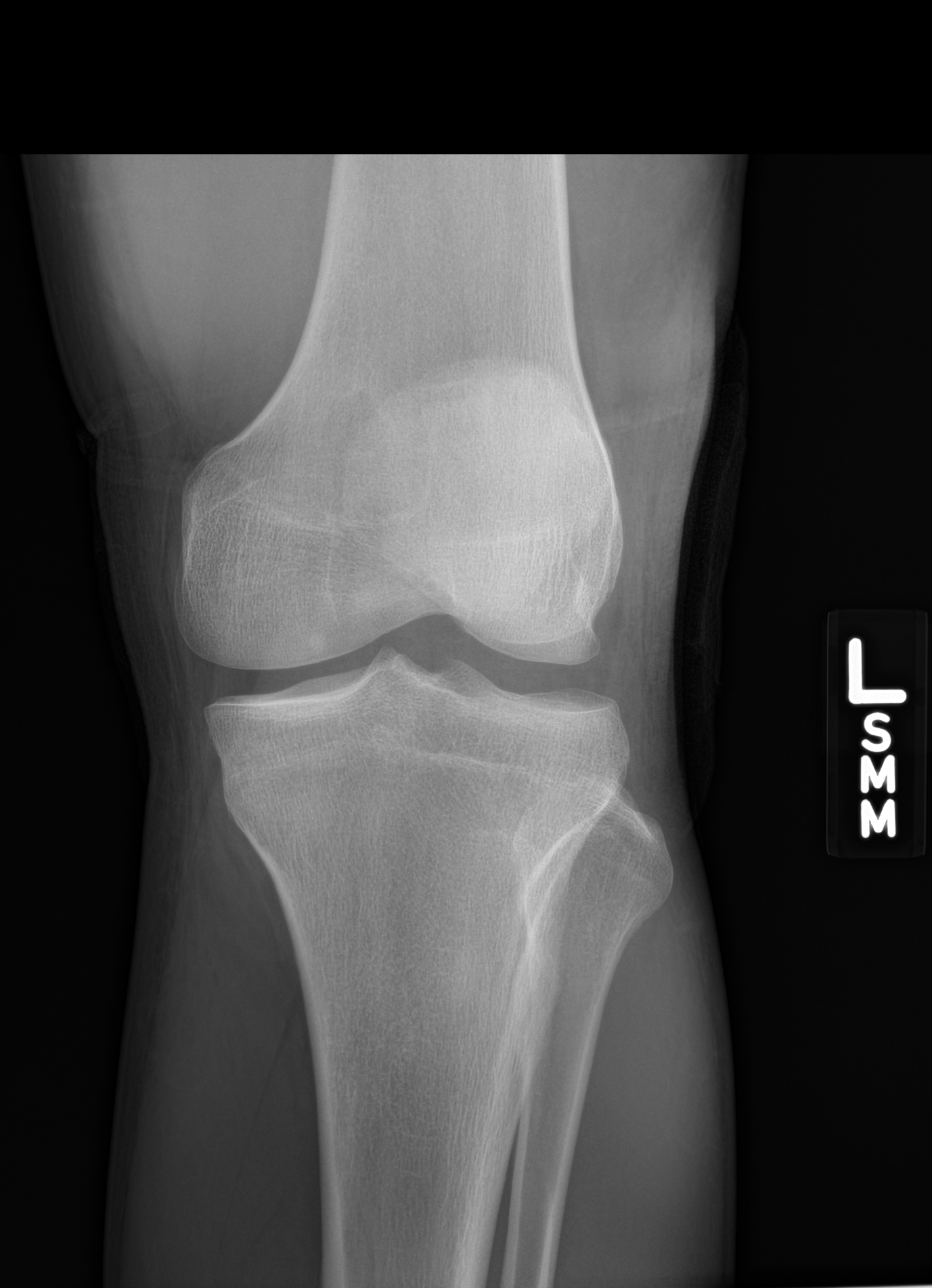

[4 of 4 positions shown; findings below may reference images not displayed]

FINDINGS: There is no bony destructive change or periosteal reaction. No joint
effusion is identified. No radiopaque foreign body is seen.
IMPRESSION: Negative exam.
